# Patient Record
Sex: Female | Born: 1950 | Race: White | Hispanic: No | Marital: Married | Smoking: Current every day smoker
Health system: Southern US, Community
[De-identification: ages and names within clinical notes are randomized; demographics above are authoritative.]

---

## 2003-06-26 ENCOUNTER — Emergency Department (HOSPITAL_COMMUNITY): Admission: EM | Admit: 2003-06-26 | Discharge: 2003-06-26 | Payer: Self-pay | Admitting: Emergency Medicine

## 2004-09-24 ENCOUNTER — Observation Stay (HOSPITAL_COMMUNITY): Admission: EM | Admit: 2004-09-24 | Discharge: 2004-09-25 | Payer: Self-pay | Admitting: Emergency Medicine

## 2010-06-02 ENCOUNTER — Encounter: Payer: Self-pay | Admitting: Internal Medicine

## 2016-03-05 DIAGNOSIS — E784 Other hyperlipidemia: Secondary | ICD-10-CM | POA: Diagnosis not present

## 2016-03-05 DIAGNOSIS — E038 Other specified hypothyroidism: Secondary | ICD-10-CM | POA: Diagnosis not present

## 2016-03-05 DIAGNOSIS — I10 Essential (primary) hypertension: Secondary | ICD-10-CM | POA: Diagnosis not present

## 2016-03-05 DIAGNOSIS — N183 Chronic kidney disease, stage 3 (moderate): Secondary | ICD-10-CM | POA: Diagnosis not present

## 2016-03-05 DIAGNOSIS — Z6833 Body mass index (BMI) 33.0-33.9, adult: Secondary | ICD-10-CM | POA: Diagnosis not present

## 2016-03-05 DIAGNOSIS — Z23 Encounter for immunization: Secondary | ICD-10-CM | POA: Diagnosis not present

## 2016-03-05 DIAGNOSIS — I129 Hypertensive chronic kidney disease with stage 1 through stage 4 chronic kidney disease, or unspecified chronic kidney disease: Secondary | ICD-10-CM | POA: Diagnosis not present

## 2016-03-05 DIAGNOSIS — F329 Major depressive disorder, single episode, unspecified: Secondary | ICD-10-CM | POA: Diagnosis not present

## 2016-03-05 DIAGNOSIS — F17200 Nicotine dependence, unspecified, uncomplicated: Secondary | ICD-10-CM | POA: Diagnosis not present

## 2016-07-04 DIAGNOSIS — M79671 Pain in right foot: Secondary | ICD-10-CM | POA: Diagnosis not present

## 2016-07-04 DIAGNOSIS — N183 Chronic kidney disease, stage 3 (moderate): Secondary | ICD-10-CM | POA: Diagnosis not present

## 2016-07-04 DIAGNOSIS — I10 Essential (primary) hypertension: Secondary | ICD-10-CM | POA: Diagnosis not present

## 2016-07-21 ENCOUNTER — Ambulatory Visit (INDEPENDENT_AMBULATORY_CARE_PROVIDER_SITE_OTHER): Payer: Medicare Other

## 2016-07-21 ENCOUNTER — Ambulatory Visit (INDEPENDENT_AMBULATORY_CARE_PROVIDER_SITE_OTHER): Payer: Medicare Other | Admitting: Podiatry

## 2016-07-21 ENCOUNTER — Encounter: Payer: Self-pay | Admitting: Podiatry

## 2016-07-21 VITALS — BP 132/84 | HR 82 | Resp 16 | Ht 63.0 in | Wt 194.0 lb

## 2016-07-21 DIAGNOSIS — M79672 Pain in left foot: Secondary | ICD-10-CM | POA: Diagnosis not present

## 2016-07-21 DIAGNOSIS — M779 Enthesopathy, unspecified: Secondary | ICD-10-CM | POA: Diagnosis not present

## 2016-07-21 DIAGNOSIS — M722 Plantar fascial fibromatosis: Secondary | ICD-10-CM | POA: Diagnosis not present

## 2016-07-21 NOTE — Progress Notes (Signed)
Subjective:     Patient ID: Olivia Chaney, female   DOB: May 24, 1950, 66 y.o.   MRN: [id number]  HPI patient states she developed a lot of pain around her right second joint and is not sure about injury but states it has gotten some better but still sore if she's on it a lot   Review of Systems  All other systems reviewed and are negative.      Objective:   Physical Exam  Constitutional: She is oriented to person, place, and time.  Cardiovascular: Intact distal pulses.   Musculoskeletal: Normal range of motion.  Neurological: She is oriented to person, place, and time.  Skin: Skin is warm.  Nursing note and vitals reviewed.  neurovascular status intact muscle strength adequate with patient found have edema of the right second MPJ with mild proximal edema and +1 pitting edema. Patient is not noted to have any proximal signs with no break in skin or other pathology noted     Assessment:     Inflammatory capsulitis second MPJ with possibility of stress fracture or other unknown bone condition    Plan:     H&P x-ray reviewed and recommended a rigid bottom shoe ice therapy and elevation. Reappoint to recheck  X-ray indicates that there is no signs of stress fracture or arthritic condition

## 2016-09-04 DIAGNOSIS — E784 Other hyperlipidemia: Secondary | ICD-10-CM | POA: Diagnosis not present

## 2016-09-04 DIAGNOSIS — E038 Other specified hypothyroidism: Secondary | ICD-10-CM | POA: Diagnosis not present

## 2016-09-04 DIAGNOSIS — I10 Essential (primary) hypertension: Secondary | ICD-10-CM | POA: Diagnosis not present

## 2016-09-12 DIAGNOSIS — I129 Hypertensive chronic kidney disease with stage 1 through stage 4 chronic kidney disease, or unspecified chronic kidney disease: Secondary | ICD-10-CM | POA: Diagnosis not present

## 2016-09-12 DIAGNOSIS — E038 Other specified hypothyroidism: Secondary | ICD-10-CM | POA: Diagnosis not present

## 2016-09-12 DIAGNOSIS — M79671 Pain in right foot: Secondary | ICD-10-CM | POA: Diagnosis not present

## 2016-09-12 DIAGNOSIS — I10 Essential (primary) hypertension: Secondary | ICD-10-CM | POA: Diagnosis not present

## 2016-09-12 DIAGNOSIS — Z6833 Body mass index (BMI) 33.0-33.9, adult: Secondary | ICD-10-CM | POA: Diagnosis not present

## 2016-09-12 DIAGNOSIS — F17200 Nicotine dependence, unspecified, uncomplicated: Secondary | ICD-10-CM | POA: Diagnosis not present

## 2016-09-12 DIAGNOSIS — E784 Other hyperlipidemia: Secondary | ICD-10-CM | POA: Diagnosis not present

## 2016-09-12 DIAGNOSIS — N183 Chronic kidney disease, stage 3 (moderate): Secondary | ICD-10-CM | POA: Diagnosis not present

## 2016-09-12 DIAGNOSIS — Z0000 Encounter for general adult medical examination without abnormal findings: Secondary | ICD-10-CM | POA: Diagnosis not present

## 2016-09-12 DIAGNOSIS — F329 Major depressive disorder, single episode, unspecified: Secondary | ICD-10-CM | POA: Diagnosis not present

## 2017-03-16 DIAGNOSIS — F17200 Nicotine dependence, unspecified, uncomplicated: Secondary | ICD-10-CM | POA: Diagnosis not present

## 2017-03-16 DIAGNOSIS — F329 Major depressive disorder, single episode, unspecified: Secondary | ICD-10-CM | POA: Diagnosis not present

## 2017-03-16 DIAGNOSIS — N183 Chronic kidney disease, stage 3 (moderate): Secondary | ICD-10-CM | POA: Diagnosis not present

## 2017-03-16 DIAGNOSIS — Z23 Encounter for immunization: Secondary | ICD-10-CM | POA: Diagnosis not present

## 2017-03-16 DIAGNOSIS — E7849 Other hyperlipidemia: Secondary | ICD-10-CM | POA: Diagnosis not present

## 2017-03-16 DIAGNOSIS — I10 Essential (primary) hypertension: Secondary | ICD-10-CM | POA: Diagnosis not present

## 2017-03-16 DIAGNOSIS — I129 Hypertensive chronic kidney disease with stage 1 through stage 4 chronic kidney disease, or unspecified chronic kidney disease: Secondary | ICD-10-CM | POA: Diagnosis not present

## 2017-03-16 DIAGNOSIS — Z6832 Body mass index (BMI) 32.0-32.9, adult: Secondary | ICD-10-CM | POA: Diagnosis not present

## 2017-03-16 DIAGNOSIS — M5416 Radiculopathy, lumbar region: Secondary | ICD-10-CM | POA: Diagnosis not present

## 2017-03-16 DIAGNOSIS — E038 Other specified hypothyroidism: Secondary | ICD-10-CM | POA: Diagnosis not present

## 2017-06-10 DIAGNOSIS — M5416 Radiculopathy, lumbar region: Secondary | ICD-10-CM | POA: Diagnosis not present

## 2017-06-12 ENCOUNTER — Other Ambulatory Visit: Payer: Self-pay | Admitting: Neurosurgery

## 2017-06-12 DIAGNOSIS — M5416 Radiculopathy, lumbar region: Secondary | ICD-10-CM

## 2017-06-28 ENCOUNTER — Ambulatory Visit
Admission: RE | Admit: 2017-06-28 | Discharge: 2017-06-28 | Disposition: A | Discharge status: Home / Self Care | Payer: Medicare Other | Source: Ambulatory Visit | Attending: Neurosurgery | Admitting: Neurosurgery

## 2017-06-28 DIAGNOSIS — M5416 Radiculopathy, lumbar region: Secondary | ICD-10-CM

## 2017-06-28 DIAGNOSIS — M48061 Spinal stenosis, lumbar region without neurogenic claudication: Secondary | ICD-10-CM | POA: Diagnosis not present

## 2017-06-30 DIAGNOSIS — M5416 Radiculopathy, lumbar region: Secondary | ICD-10-CM | POA: Diagnosis not present

## 2017-06-30 DIAGNOSIS — M48062 Spinal stenosis, lumbar region with neurogenic claudication: Secondary | ICD-10-CM | POA: Diagnosis not present

## 2017-06-30 DIAGNOSIS — Z6832 Body mass index (BMI) 32.0-32.9, adult: Secondary | ICD-10-CM | POA: Diagnosis not present

## 2017-06-30 DIAGNOSIS — I10 Essential (primary) hypertension: Secondary | ICD-10-CM | POA: Diagnosis not present

## 2017-07-13 DIAGNOSIS — M5416 Radiculopathy, lumbar region: Secondary | ICD-10-CM | POA: Diagnosis not present

## 2017-07-13 DIAGNOSIS — M48062 Spinal stenosis, lumbar region with neurogenic claudication: Secondary | ICD-10-CM | POA: Diagnosis not present

## 2017-07-22 DIAGNOSIS — M48062 Spinal stenosis, lumbar region with neurogenic claudication: Secondary | ICD-10-CM | POA: Diagnosis not present

## 2017-07-22 DIAGNOSIS — M5416 Radiculopathy, lumbar region: Secondary | ICD-10-CM | POA: Diagnosis not present

## 2017-09-09 DIAGNOSIS — E7849 Other hyperlipidemia: Secondary | ICD-10-CM | POA: Diagnosis not present

## 2017-09-09 DIAGNOSIS — R82998 Other abnormal findings in urine: Secondary | ICD-10-CM | POA: Diagnosis not present

## 2017-09-09 DIAGNOSIS — E038 Other specified hypothyroidism: Secondary | ICD-10-CM | POA: Diagnosis not present

## 2017-09-09 DIAGNOSIS — I10 Essential (primary) hypertension: Secondary | ICD-10-CM | POA: Diagnosis not present

## 2017-09-16 DIAGNOSIS — Z0000 Encounter for general adult medical examination without abnormal findings: Secondary | ICD-10-CM | POA: Diagnosis not present

## 2017-09-16 DIAGNOSIS — M48061 Spinal stenosis, lumbar region without neurogenic claudication: Secondary | ICD-10-CM | POA: Diagnosis not present

## 2017-09-16 DIAGNOSIS — N183 Chronic kidney disease, stage 3 (moderate): Secondary | ICD-10-CM | POA: Diagnosis not present

## 2017-09-16 DIAGNOSIS — I10 Essential (primary) hypertension: Secondary | ICD-10-CM | POA: Diagnosis not present

## 2017-09-16 DIAGNOSIS — Z6832 Body mass index (BMI) 32.0-32.9, adult: Secondary | ICD-10-CM | POA: Diagnosis not present

## 2017-09-16 DIAGNOSIS — I129 Hypertensive chronic kidney disease with stage 1 through stage 4 chronic kidney disease, or unspecified chronic kidney disease: Secondary | ICD-10-CM | POA: Diagnosis not present

## 2017-09-16 DIAGNOSIS — E669 Obesity, unspecified: Secondary | ICD-10-CM | POA: Diagnosis not present

## 2017-09-16 DIAGNOSIS — F329 Major depressive disorder, single episode, unspecified: Secondary | ICD-10-CM | POA: Diagnosis not present

## 2017-09-16 DIAGNOSIS — M5416 Radiculopathy, lumbar region: Secondary | ICD-10-CM | POA: Diagnosis not present

## 2017-09-16 DIAGNOSIS — E7849 Other hyperlipidemia: Secondary | ICD-10-CM | POA: Diagnosis not present

## 2017-09-16 DIAGNOSIS — F17200 Nicotine dependence, unspecified, uncomplicated: Secondary | ICD-10-CM | POA: Diagnosis not present

## 2017-09-16 DIAGNOSIS — E038 Other specified hypothyroidism: Secondary | ICD-10-CM | POA: Diagnosis not present

## 2017-09-18 ENCOUNTER — Other Ambulatory Visit: Payer: Self-pay | Admitting: Internal Medicine

## 2017-09-18 DIAGNOSIS — Z720 Tobacco use: Secondary | ICD-10-CM

## 2018-03-29 DIAGNOSIS — I10 Essential (primary) hypertension: Secondary | ICD-10-CM | POA: Diagnosis not present

## 2018-03-29 DIAGNOSIS — E038 Other specified hypothyroidism: Secondary | ICD-10-CM | POA: Diagnosis not present

## 2018-03-29 DIAGNOSIS — Z23 Encounter for immunization: Secondary | ICD-10-CM | POA: Diagnosis not present

## 2018-03-29 DIAGNOSIS — F17200 Nicotine dependence, unspecified, uncomplicated: Secondary | ICD-10-CM | POA: Diagnosis not present

## 2018-03-29 DIAGNOSIS — I129 Hypertensive chronic kidney disease with stage 1 through stage 4 chronic kidney disease, or unspecified chronic kidney disease: Secondary | ICD-10-CM | POA: Diagnosis not present

## 2018-03-29 DIAGNOSIS — E669 Obesity, unspecified: Secondary | ICD-10-CM | POA: Diagnosis not present

## 2018-03-29 DIAGNOSIS — M5416 Radiculopathy, lumbar region: Secondary | ICD-10-CM | POA: Diagnosis not present

## 2018-03-29 DIAGNOSIS — N183 Chronic kidney disease, stage 3 (moderate): Secondary | ICD-10-CM | POA: Diagnosis not present

## 2018-03-29 DIAGNOSIS — E7849 Other hyperlipidemia: Secondary | ICD-10-CM | POA: Diagnosis not present

## 2018-03-29 DIAGNOSIS — F329 Major depressive disorder, single episode, unspecified: Secondary | ICD-10-CM | POA: Diagnosis not present

## 2018-03-29 DIAGNOSIS — M48061 Spinal stenosis, lumbar region without neurogenic claudication: Secondary | ICD-10-CM | POA: Diagnosis not present

## 2018-11-01 DIAGNOSIS — E038 Other specified hypothyroidism: Secondary | ICD-10-CM | POA: Diagnosis not present

## 2018-11-01 DIAGNOSIS — I129 Hypertensive chronic kidney disease with stage 1 through stage 4 chronic kidney disease, or unspecified chronic kidney disease: Secondary | ICD-10-CM | POA: Diagnosis not present

## 2018-11-01 DIAGNOSIS — R82998 Other abnormal findings in urine: Secondary | ICD-10-CM | POA: Diagnosis not present

## 2018-11-03 DIAGNOSIS — M48061 Spinal stenosis, lumbar region without neurogenic claudication: Secondary | ICD-10-CM | POA: Diagnosis not present

## 2018-11-03 DIAGNOSIS — Z1331 Encounter for screening for depression: Secondary | ICD-10-CM | POA: Diagnosis not present

## 2018-11-03 DIAGNOSIS — E039 Hypothyroidism, unspecified: Secondary | ICD-10-CM | POA: Diagnosis not present

## 2018-11-03 DIAGNOSIS — Z1339 Encounter for screening examination for other mental health and behavioral disorders: Secondary | ICD-10-CM | POA: Diagnosis not present

## 2018-11-03 DIAGNOSIS — F17200 Nicotine dependence, unspecified, uncomplicated: Secondary | ICD-10-CM | POA: Diagnosis not present

## 2018-11-03 DIAGNOSIS — I129 Hypertensive chronic kidney disease with stage 1 through stage 4 chronic kidney disease, or unspecified chronic kidney disease: Secondary | ICD-10-CM | POA: Diagnosis not present

## 2018-11-03 DIAGNOSIS — I10 Essential (primary) hypertension: Secondary | ICD-10-CM | POA: Diagnosis not present

## 2018-11-03 DIAGNOSIS — N183 Chronic kidney disease, stage 3 (moderate): Secondary | ICD-10-CM | POA: Diagnosis not present

## 2018-11-03 DIAGNOSIS — Z0000 Encounter for general adult medical examination without abnormal findings: Secondary | ICD-10-CM | POA: Diagnosis not present

## 2018-11-03 DIAGNOSIS — E669 Obesity, unspecified: Secondary | ICD-10-CM | POA: Diagnosis not present

## 2018-11-28 IMAGING — MR MR LUMBAR SPINE W/O CM
4 of 5 series · 25 of 48 positions shown · non-contrast
Comparison: None.

CLINICAL DATA: Low back pain radiating to the left lower extremity

EXAM:
MRI LUMBAR SPINE WITHOUT CONTRAST
TECHNIQUE: Multiplanar, multisequence MR imaging of the lumbar spine was
performed. No intravenous contrast was administered.

[Series 2: T2 · sagittal · 4.0mm · 0.55mm/px · 6 of 14 slices shown (1 of 2)]
[im 1/14]
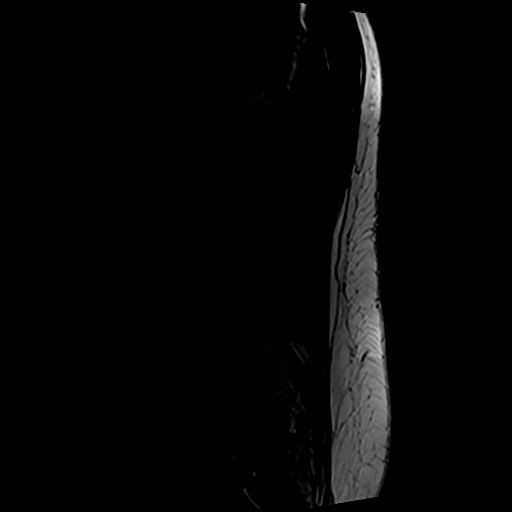
[im 3/14]
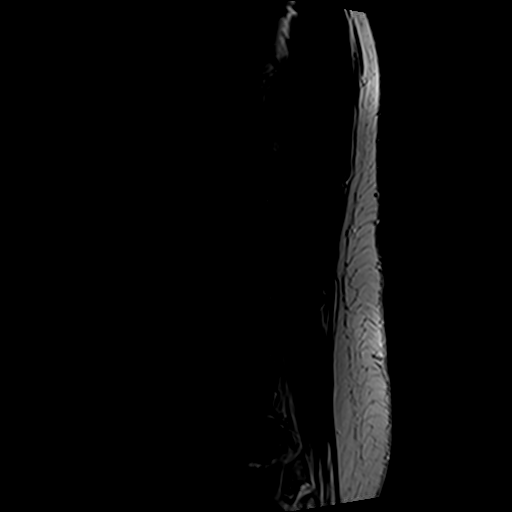
[im 6/14]
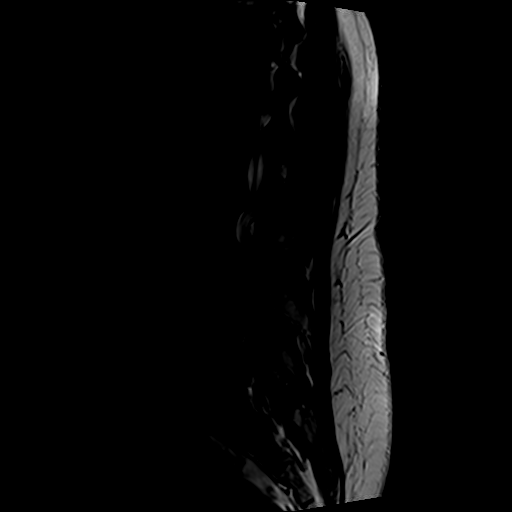
[im 8/14]
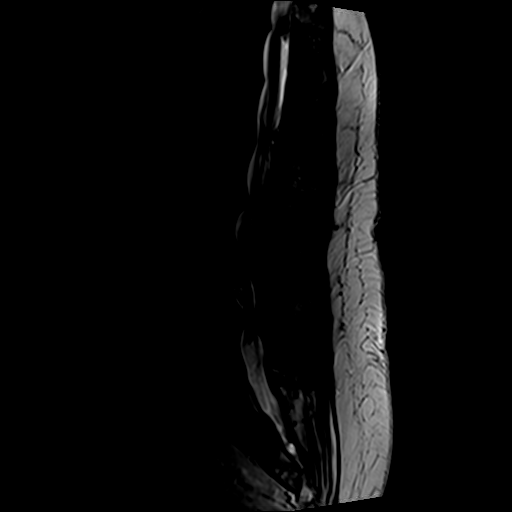
[im 11/14]
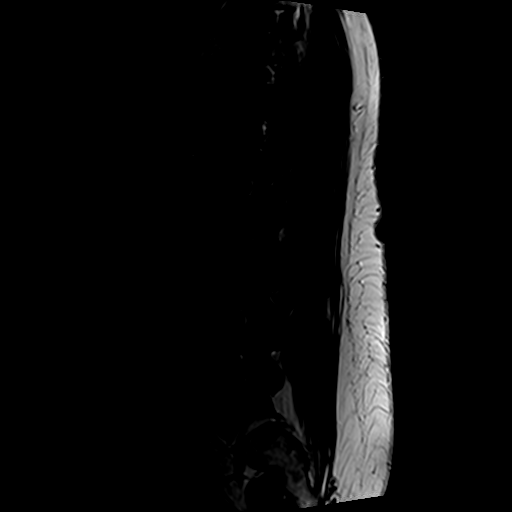
[im 14/14]
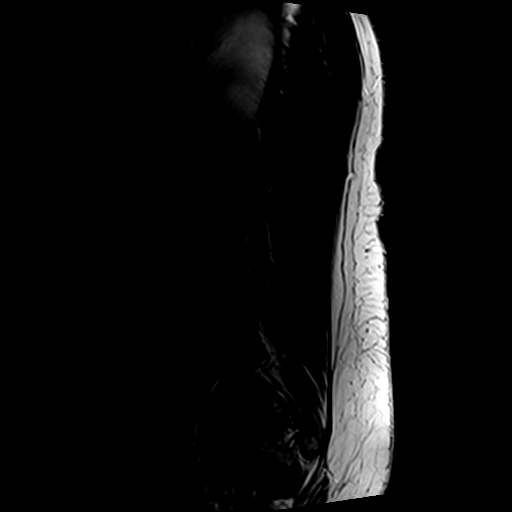

[Series 4: T1 · sagittal · 4.0mm · 0.55mm/px · 6 of 14 slices shown (1 of 2)]
[im 1/14]
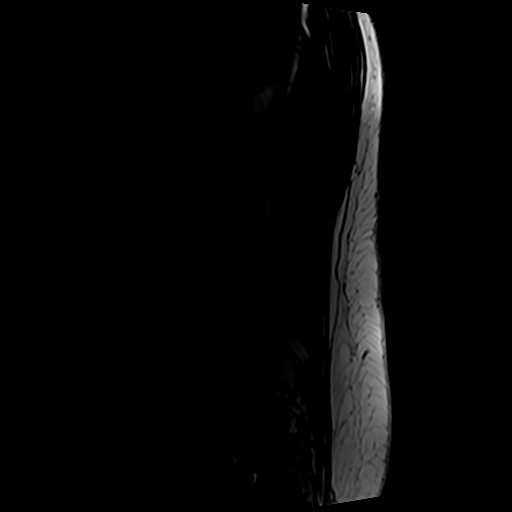
[im 3/14]
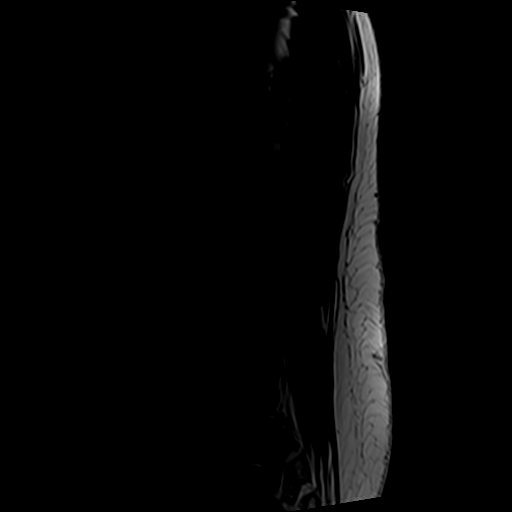
[im 6/14]
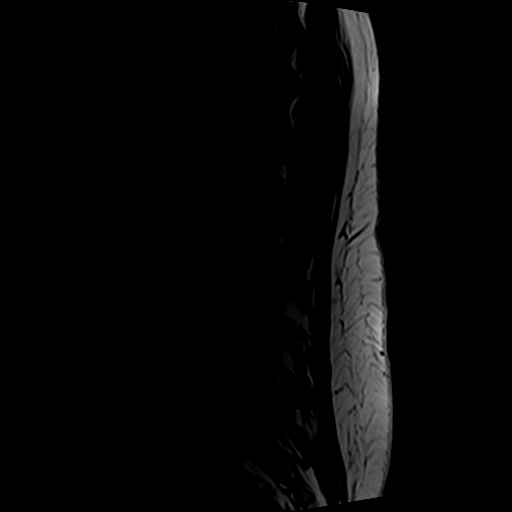
[im 8/14]
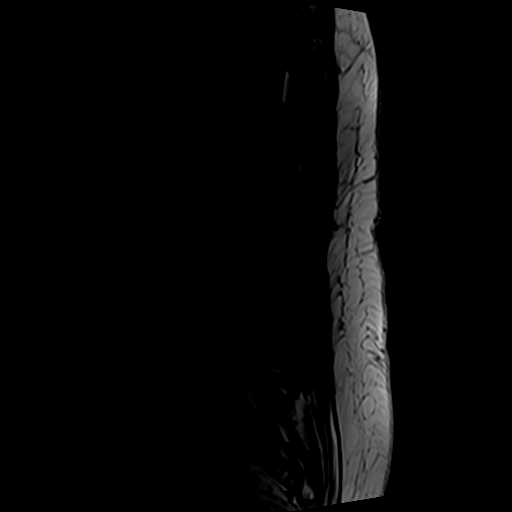
[im 11/14]
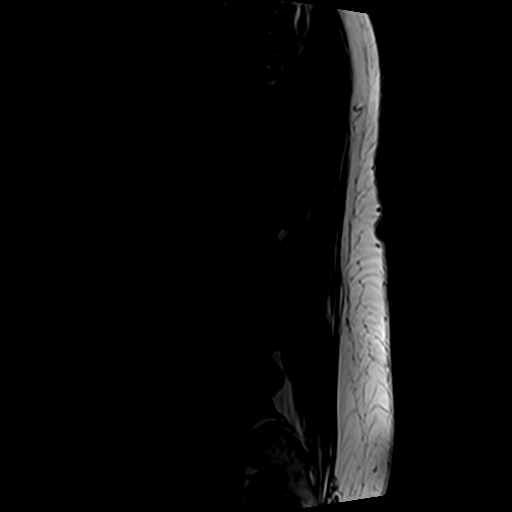
[im 14/14]
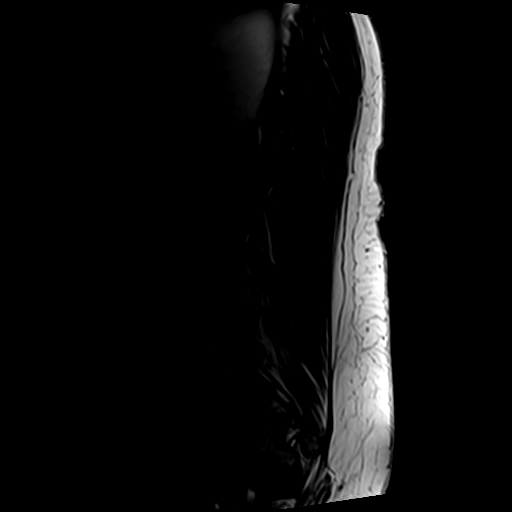

[Series 5: T2 · axial · 4.0mm · 0.70mm/px · z∈[-131,+76]mm · 9 of 37 slices shown (2 of 2)]
[im 1/37]
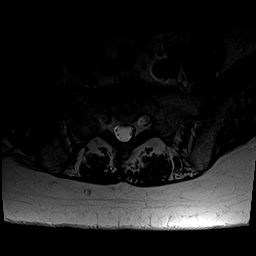
[im 6/37]
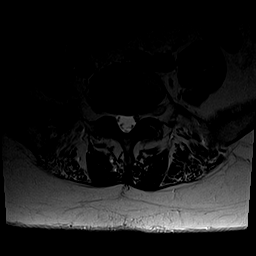
[im 11/37]
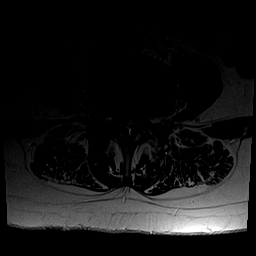
[im 16/37]
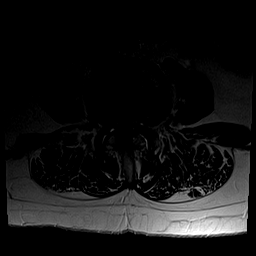
[im 19/37]
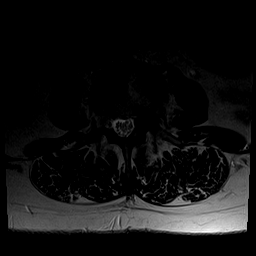
[im 21/37]
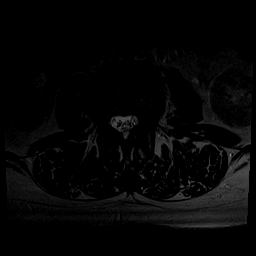
[im 26/37]
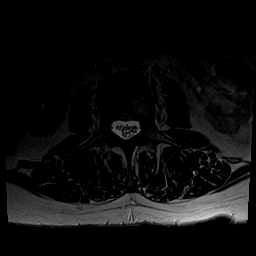
[im 31/37]
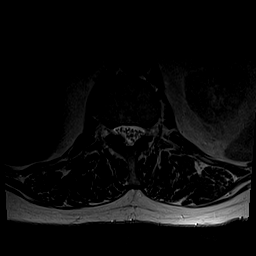
[im 37/37]
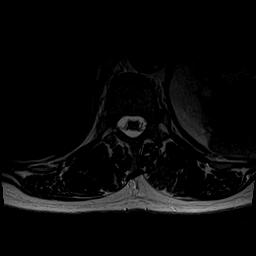

[Series 6: T1 · axial · 4.0mm · 0.35mm/px · z∈[-131,+40]mm · 4 of 37 slices shown (2 of 2)]
[im 1/37]
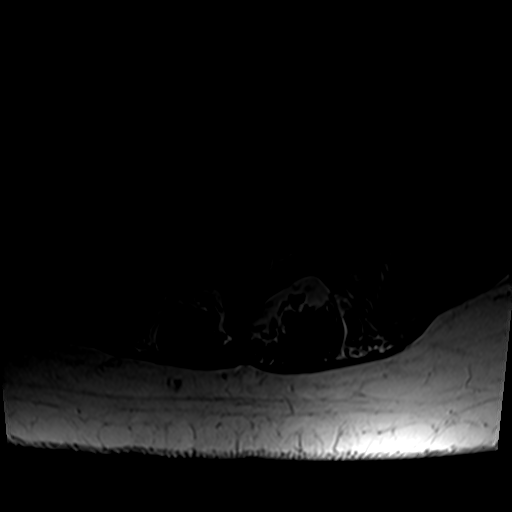
[im 6/37]
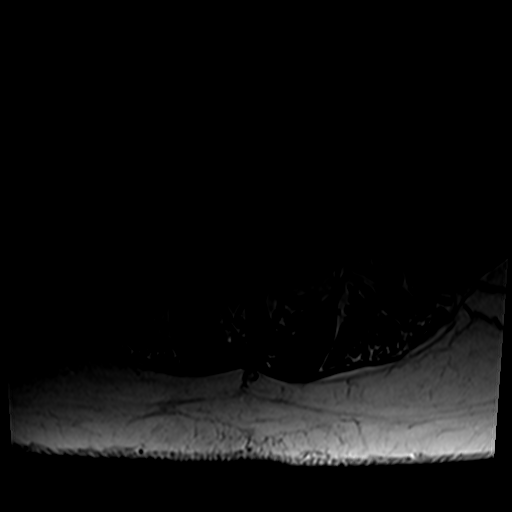
[im 19/37]
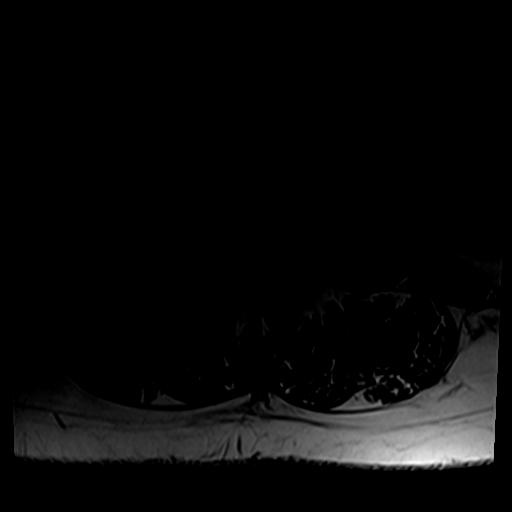
[im 31/37]
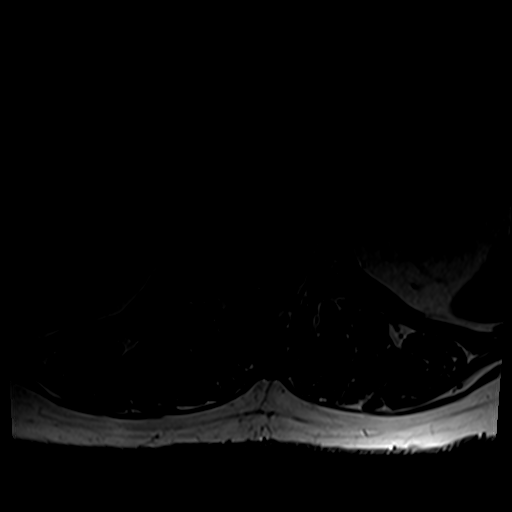

[25 of 48 positions shown; findings below may reference images not displayed]

FINDINGS: Segmentation:  Standard.

Alignment:  Physiologic.

Vertebrae:  Type 1 discogenic signal changes at the L3-L4 endplates.

Conus medullaris and cauda equina: Conus extends to the L1 level.
There is crowding of the cauda equina nerve roots with buckling at
multiple levels.

Paraspinal and other soft tissues: The visualized vascular,
retroperitoneal and paraspinal structures are normal.

Disc levels:

T11-T12: Imaged only in the sagittal plane.  No high-grade stenosis.

T12-L1: Normal disc space and facets. No spinal canal or
neuroforaminal stenosis.

L1-L2: Severe right and moderate left facet hypertrophy. There is
crowding of the right lateral recess posteriorly, with medial
displacement of the descending nerve roots. Mild right foraminal
stenosis. No spinal canal stenosis.

L2-L3: Disc bulge and mild facet hypertrophy. Mild spinal canal
stenosis.

L3-L4: Diffuse disc bulge and severe facet hypertrophy. This causes
severe spinal canal stenosis and crowds the cauda equina nerve
roots. There is also severe left foraminal stenosis. Moderate right
foraminal stenosis.

L4-L5: Diffuse disc bulge and severe right, moderate left facet
hypertrophy with severe spinal canal stenosis. Moderate right and
mild left neural foraminal stenosis.

L5-S1: Normal disc space and facets. No spinal canal or
neuroforaminal stenosis.

Visualized sacrum: Normal.
IMPRESSION: 1. Severe spinal canal stenosis at L3-L4 and L4-L5 due to
combination of large, diffuse disc bulges and severe facet
arthrosis.
2. Severe left L3-4 foraminal stenosis, which is a likely source of
radiculopathy.
3. Moderate right L4-5 neural foraminal stenosis.
4. Multilevel lumbar facet arthrosis may be a source of local low
back pain.

These results will be called to the ordering clinician or
representative by the Radiologist Assistant, and communication
documented in the PACS or zVision Dashboard.

## 2019-05-16 DIAGNOSIS — F17200 Nicotine dependence, unspecified, uncomplicated: Secondary | ICD-10-CM | POA: Diagnosis not present

## 2019-05-16 DIAGNOSIS — E785 Hyperlipidemia, unspecified: Secondary | ICD-10-CM | POA: Diagnosis not present

## 2019-05-16 DIAGNOSIS — M48061 Spinal stenosis, lumbar region without neurogenic claudication: Secondary | ICD-10-CM | POA: Diagnosis not present

## 2019-05-16 DIAGNOSIS — I129 Hypertensive chronic kidney disease with stage 1 through stage 4 chronic kidney disease, or unspecified chronic kidney disease: Secondary | ICD-10-CM | POA: Diagnosis not present

## 2019-05-16 DIAGNOSIS — E039 Hypothyroidism, unspecified: Secondary | ICD-10-CM | POA: Diagnosis not present

## 2019-05-16 DIAGNOSIS — N1831 Chronic kidney disease, stage 3a: Secondary | ICD-10-CM | POA: Diagnosis not present

## 2019-12-20 DIAGNOSIS — E039 Hypothyroidism, unspecified: Secondary | ICD-10-CM | POA: Diagnosis not present

## 2019-12-20 DIAGNOSIS — E785 Hyperlipidemia, unspecified: Secondary | ICD-10-CM | POA: Diagnosis not present

## 2019-12-26 DIAGNOSIS — I10 Essential (primary) hypertension: Secondary | ICD-10-CM | POA: Diagnosis not present

## 2019-12-26 DIAGNOSIS — N1832 Chronic kidney disease, stage 3b: Secondary | ICD-10-CM | POA: Diagnosis not present

## 2019-12-26 DIAGNOSIS — M5416 Radiculopathy, lumbar region: Secondary | ICD-10-CM | POA: Diagnosis not present

## 2019-12-26 DIAGNOSIS — Z0000 Encounter for general adult medical examination without abnormal findings: Secondary | ICD-10-CM | POA: Diagnosis not present

## 2019-12-26 DIAGNOSIS — F17200 Nicotine dependence, unspecified, uncomplicated: Secondary | ICD-10-CM | POA: Diagnosis not present

## 2019-12-26 DIAGNOSIS — E039 Hypothyroidism, unspecified: Secondary | ICD-10-CM | POA: Diagnosis not present

## 2019-12-26 DIAGNOSIS — E785 Hyperlipidemia, unspecified: Secondary | ICD-10-CM | POA: Diagnosis not present

## 2019-12-26 DIAGNOSIS — I129 Hypertensive chronic kidney disease with stage 1 through stage 4 chronic kidney disease, or unspecified chronic kidney disease: Secondary | ICD-10-CM | POA: Diagnosis not present

## 2019-12-26 DIAGNOSIS — E669 Obesity, unspecified: Secondary | ICD-10-CM | POA: Diagnosis not present

## 2020-06-27 DIAGNOSIS — F17200 Nicotine dependence, unspecified, uncomplicated: Secondary | ICD-10-CM | POA: Diagnosis not present

## 2020-06-27 DIAGNOSIS — I129 Hypertensive chronic kidney disease with stage 1 through stage 4 chronic kidney disease, or unspecified chronic kidney disease: Secondary | ICD-10-CM | POA: Diagnosis not present

## 2020-06-27 DIAGNOSIS — N1832 Chronic kidney disease, stage 3b: Secondary | ICD-10-CM | POA: Diagnosis not present

## 2020-06-27 DIAGNOSIS — M5416 Radiculopathy, lumbar region: Secondary | ICD-10-CM | POA: Diagnosis not present

## 2020-08-22 DIAGNOSIS — I10 Essential (primary) hypertension: Secondary | ICD-10-CM | POA: Diagnosis not present

## 2020-08-22 DIAGNOSIS — Z6830 Body mass index (BMI) 30.0-30.9, adult: Secondary | ICD-10-CM | POA: Diagnosis not present

## 2020-08-22 DIAGNOSIS — M5416 Radiculopathy, lumbar region: Secondary | ICD-10-CM | POA: Diagnosis not present

## 2021-01-02 DIAGNOSIS — E785 Hyperlipidemia, unspecified: Secondary | ICD-10-CM | POA: Diagnosis not present

## 2021-01-02 DIAGNOSIS — E039 Hypothyroidism, unspecified: Secondary | ICD-10-CM | POA: Diagnosis not present

## 2021-02-13 DIAGNOSIS — E785 Hyperlipidemia, unspecified: Secondary | ICD-10-CM | POA: Diagnosis not present

## 2021-02-13 DIAGNOSIS — F17200 Nicotine dependence, unspecified, uncomplicated: Secondary | ICD-10-CM | POA: Diagnosis not present

## 2021-02-13 DIAGNOSIS — Z23 Encounter for immunization: Secondary | ICD-10-CM | POA: Diagnosis not present

## 2021-02-13 DIAGNOSIS — E669 Obesity, unspecified: Secondary | ICD-10-CM | POA: Diagnosis not present

## 2021-02-13 DIAGNOSIS — R82998 Other abnormal findings in urine: Secondary | ICD-10-CM | POA: Diagnosis not present

## 2021-02-13 DIAGNOSIS — F329 Major depressive disorder, single episode, unspecified: Secondary | ICD-10-CM | POA: Diagnosis not present

## 2021-02-13 DIAGNOSIS — I129 Hypertensive chronic kidney disease with stage 1 through stage 4 chronic kidney disease, or unspecified chronic kidney disease: Secondary | ICD-10-CM | POA: Diagnosis not present

## 2021-02-13 DIAGNOSIS — Z1339 Encounter for screening examination for other mental health and behavioral disorders: Secondary | ICD-10-CM | POA: Diagnosis not present

## 2021-02-13 DIAGNOSIS — Z1331 Encounter for screening for depression: Secondary | ICD-10-CM | POA: Diagnosis not present

## 2021-02-13 DIAGNOSIS — Z0000 Encounter for general adult medical examination without abnormal findings: Secondary | ICD-10-CM | POA: Diagnosis not present

## 2021-02-13 DIAGNOSIS — E039 Hypothyroidism, unspecified: Secondary | ICD-10-CM | POA: Diagnosis not present

## 2021-02-13 DIAGNOSIS — I10 Essential (primary) hypertension: Secondary | ICD-10-CM | POA: Diagnosis not present

## 2021-05-08 DIAGNOSIS — Z20822 Contact with and (suspected) exposure to covid-19: Secondary | ICD-10-CM | POA: Diagnosis not present

## 2021-07-29 DIAGNOSIS — H524 Presbyopia: Secondary | ICD-10-CM | POA: Diagnosis not present

## 2021-07-29 DIAGNOSIS — D3131 Benign neoplasm of right choroid: Secondary | ICD-10-CM | POA: Diagnosis not present

## 2021-07-29 DIAGNOSIS — H2513 Age-related nuclear cataract, bilateral: Secondary | ICD-10-CM | POA: Diagnosis not present

## 2021-08-14 DIAGNOSIS — E669 Obesity, unspecified: Secondary | ICD-10-CM | POA: Diagnosis not present

## 2021-08-14 DIAGNOSIS — N1832 Chronic kidney disease, stage 3b: Secondary | ICD-10-CM | POA: Diagnosis not present

## 2021-08-14 DIAGNOSIS — F17200 Nicotine dependence, unspecified, uncomplicated: Secondary | ICD-10-CM | POA: Diagnosis not present

## 2021-08-14 DIAGNOSIS — I129 Hypertensive chronic kidney disease with stage 1 through stage 4 chronic kidney disease, or unspecified chronic kidney disease: Secondary | ICD-10-CM | POA: Diagnosis not present

## 2022-02-17 DIAGNOSIS — I129 Hypertensive chronic kidney disease with stage 1 through stage 4 chronic kidney disease, or unspecified chronic kidney disease: Secondary | ICD-10-CM | POA: Diagnosis not present

## 2022-02-17 DIAGNOSIS — F17200 Nicotine dependence, unspecified, uncomplicated: Secondary | ICD-10-CM | POA: Diagnosis not present

## 2022-02-17 DIAGNOSIS — N1832 Chronic kidney disease, stage 3b: Secondary | ICD-10-CM | POA: Diagnosis not present

## 2022-06-05 DIAGNOSIS — E871 Hypo-osmolality and hyponatremia: Secondary | ICD-10-CM | POA: Diagnosis not present

## 2022-06-05 DIAGNOSIS — J329 Chronic sinusitis, unspecified: Secondary | ICD-10-CM | POA: Diagnosis not present

## 2022-06-05 DIAGNOSIS — I129 Hypertensive chronic kidney disease with stage 1 through stage 4 chronic kidney disease, or unspecified chronic kidney disease: Secondary | ICD-10-CM | POA: Diagnosis not present

## 2022-06-05 DIAGNOSIS — B9689 Other specified bacterial agents as the cause of diseases classified elsewhere: Secondary | ICD-10-CM | POA: Diagnosis not present

## 2022-06-05 DIAGNOSIS — R42 Dizziness and giddiness: Secondary | ICD-10-CM | POA: Diagnosis not present

## 2022-09-03 DIAGNOSIS — I129 Hypertensive chronic kidney disease with stage 1 through stage 4 chronic kidney disease, or unspecified chronic kidney disease: Secondary | ICD-10-CM | POA: Diagnosis not present

## 2022-09-03 DIAGNOSIS — N1832 Chronic kidney disease, stage 3b: Secondary | ICD-10-CM | POA: Diagnosis not present

## 2022-09-03 DIAGNOSIS — F17200 Nicotine dependence, unspecified, uncomplicated: Secondary | ICD-10-CM | POA: Diagnosis not present

## 2022-09-03 DIAGNOSIS — E669 Obesity, unspecified: Secondary | ICD-10-CM | POA: Diagnosis not present

## 2023-02-25 DIAGNOSIS — E039 Hypothyroidism, unspecified: Secondary | ICD-10-CM | POA: Diagnosis not present

## 2023-02-25 DIAGNOSIS — Z1389 Encounter for screening for other disorder: Secondary | ICD-10-CM | POA: Diagnosis not present

## 2023-02-25 DIAGNOSIS — E785 Hyperlipidemia, unspecified: Secondary | ICD-10-CM | POA: Diagnosis not present

## 2023-03-04 DIAGNOSIS — J449 Chronic obstructive pulmonary disease, unspecified: Secondary | ICD-10-CM | POA: Diagnosis not present

## 2023-03-04 DIAGNOSIS — E785 Hyperlipidemia, unspecified: Secondary | ICD-10-CM | POA: Diagnosis not present

## 2023-03-04 DIAGNOSIS — F17200 Nicotine dependence, unspecified, uncomplicated: Secondary | ICD-10-CM | POA: Diagnosis not present

## 2023-03-04 DIAGNOSIS — R82998 Other abnormal findings in urine: Secondary | ICD-10-CM | POA: Diagnosis not present

## 2023-03-04 DIAGNOSIS — I129 Hypertensive chronic kidney disease with stage 1 through stage 4 chronic kidney disease, or unspecified chronic kidney disease: Secondary | ICD-10-CM | POA: Diagnosis not present

## 2023-03-04 DIAGNOSIS — N1832 Chronic kidney disease, stage 3b: Secondary | ICD-10-CM | POA: Diagnosis not present

## 2023-03-04 DIAGNOSIS — E669 Obesity, unspecified: Secondary | ICD-10-CM | POA: Diagnosis not present

## 2023-03-04 DIAGNOSIS — Z1331 Encounter for screening for depression: Secondary | ICD-10-CM | POA: Diagnosis not present

## 2023-03-04 DIAGNOSIS — Z0000 Encounter for general adult medical examination without abnormal findings: Secondary | ICD-10-CM | POA: Diagnosis not present

## 2023-03-04 DIAGNOSIS — Z1339 Encounter for screening examination for other mental health and behavioral disorders: Secondary | ICD-10-CM | POA: Diagnosis not present

## 2023-03-04 DIAGNOSIS — E039 Hypothyroidism, unspecified: Secondary | ICD-10-CM | POA: Diagnosis not present

## 2023-05-14 DIAGNOSIS — J029 Acute pharyngitis, unspecified: Secondary | ICD-10-CM | POA: Diagnosis not present

## 2023-05-14 DIAGNOSIS — R0981 Nasal congestion: Secondary | ICD-10-CM | POA: Diagnosis not present

## 2023-05-14 DIAGNOSIS — F17200 Nicotine dependence, unspecified, uncomplicated: Secondary | ICD-10-CM | POA: Diagnosis not present

## 2023-05-14 DIAGNOSIS — R058 Other specified cough: Secondary | ICD-10-CM | POA: Diagnosis not present

## 2023-05-14 DIAGNOSIS — J449 Chronic obstructive pulmonary disease, unspecified: Secondary | ICD-10-CM | POA: Diagnosis not present

## 2023-05-14 DIAGNOSIS — J40 Bronchitis, not specified as acute or chronic: Secondary | ICD-10-CM | POA: Diagnosis not present

## 2023-05-14 DIAGNOSIS — Z1152 Encounter for screening for COVID-19: Secondary | ICD-10-CM | POA: Diagnosis not present

## 2023-05-14 DIAGNOSIS — I10 Essential (primary) hypertension: Secondary | ICD-10-CM | POA: Diagnosis not present

## 2023-09-23 DIAGNOSIS — J449 Chronic obstructive pulmonary disease, unspecified: Secondary | ICD-10-CM | POA: Diagnosis not present

## 2023-09-23 DIAGNOSIS — I129 Hypertensive chronic kidney disease with stage 1 through stage 4 chronic kidney disease, or unspecified chronic kidney disease: Secondary | ICD-10-CM | POA: Diagnosis not present

## 2023-09-23 DIAGNOSIS — F17200 Nicotine dependence, unspecified, uncomplicated: Secondary | ICD-10-CM | POA: Diagnosis not present

## 2023-09-23 DIAGNOSIS — E039 Hypothyroidism, unspecified: Secondary | ICD-10-CM | POA: Diagnosis not present

## 2023-09-23 DIAGNOSIS — E785 Hyperlipidemia, unspecified: Secondary | ICD-10-CM | POA: Diagnosis not present

## 2023-09-23 DIAGNOSIS — N1832 Chronic kidney disease, stage 3b: Secondary | ICD-10-CM | POA: Diagnosis not present

## 2024-03-09 DIAGNOSIS — E785 Hyperlipidemia, unspecified: Secondary | ICD-10-CM | POA: Diagnosis not present

## 2024-03-09 DIAGNOSIS — E039 Hypothyroidism, unspecified: Secondary | ICD-10-CM | POA: Diagnosis not present

## 2024-03-09 DIAGNOSIS — Z1212 Encounter for screening for malignant neoplasm of rectum: Secondary | ICD-10-CM | POA: Diagnosis not present

## 2024-03-09 DIAGNOSIS — I129 Hypertensive chronic kidney disease with stage 1 through stage 4 chronic kidney disease, or unspecified chronic kidney disease: Secondary | ICD-10-CM | POA: Diagnosis not present

## 2024-03-16 DIAGNOSIS — Z1331 Encounter for screening for depression: Secondary | ICD-10-CM | POA: Diagnosis not present

## 2024-03-16 DIAGNOSIS — F17200 Nicotine dependence, unspecified, uncomplicated: Secondary | ICD-10-CM | POA: Diagnosis not present

## 2024-03-16 DIAGNOSIS — R82998 Other abnormal findings in urine: Secondary | ICD-10-CM | POA: Diagnosis not present

## 2024-03-16 DIAGNOSIS — Z1339 Encounter for screening examination for other mental health and behavioral disorders: Secondary | ICD-10-CM | POA: Diagnosis not present

## 2024-03-16 DIAGNOSIS — E785 Hyperlipidemia, unspecified: Secondary | ICD-10-CM | POA: Diagnosis not present

## 2024-03-16 DIAGNOSIS — Z0000 Encounter for general adult medical examination without abnormal findings: Secondary | ICD-10-CM | POA: Diagnosis not present

## 2024-03-16 DIAGNOSIS — I10 Essential (primary) hypertension: Secondary | ICD-10-CM | POA: Diagnosis not present

## 2024-03-16 DIAGNOSIS — J449 Chronic obstructive pulmonary disease, unspecified: Secondary | ICD-10-CM | POA: Diagnosis not present
